# Patient Record
Sex: Male | Born: 1985 | Race: White | Hispanic: Yes | Marital: Married | Smoking: Never smoker
Health system: Southern US, Community
[De-identification: ages and names within clinical notes are randomized; demographics above are authoritative.]

---

## 2013-05-13 ENCOUNTER — Emergency Department (HOSPITAL_COMMUNITY)
Admission: EM | Admit: 2013-05-13 | Discharge: 2013-05-13 | Disposition: A | Discharge status: Home / Self Care | Payer: Self-pay | Attending: Emergency Medicine | Admitting: Emergency Medicine

## 2013-05-13 ENCOUNTER — Encounter (HOSPITAL_COMMUNITY): Payer: Self-pay | Admitting: Emergency Medicine

## 2013-05-13 ENCOUNTER — Emergency Department (HOSPITAL_COMMUNITY): Payer: Self-pay

## 2013-05-13 DIAGNOSIS — S0081XA Abrasion of other part of head, initial encounter: Secondary | ICD-10-CM

## 2013-05-13 DIAGNOSIS — Y9301 Activity, walking, marching and hiking: Secondary | ICD-10-CM | POA: Insufficient documentation

## 2013-05-13 DIAGNOSIS — S0101XA Laceration without foreign body of scalp, initial encounter: Secondary | ICD-10-CM

## 2013-05-13 DIAGNOSIS — F1010 Alcohol abuse, uncomplicated: Secondary | ICD-10-CM | POA: Insufficient documentation

## 2013-05-13 DIAGNOSIS — S0100XA Unspecified open wound of scalp, initial encounter: Secondary | ICD-10-CM | POA: Insufficient documentation

## 2013-05-13 DIAGNOSIS — R4182 Altered mental status, unspecified: Secondary | ICD-10-CM | POA: Insufficient documentation

## 2013-05-13 DIAGNOSIS — W19XXXA Unspecified fall, initial encounter: Secondary | ICD-10-CM

## 2013-05-13 DIAGNOSIS — R296 Repeated falls: Secondary | ICD-10-CM | POA: Insufficient documentation

## 2013-05-13 DIAGNOSIS — IMO0002 Reserved for concepts with insufficient information to code with codable children: Secondary | ICD-10-CM | POA: Insufficient documentation

## 2013-05-13 DIAGNOSIS — F10929 Alcohol use, unspecified with intoxication, unspecified: Secondary | ICD-10-CM

## 2013-05-13 DIAGNOSIS — Y92410 Unspecified street and highway as the place of occurrence of the external cause: Secondary | ICD-10-CM | POA: Insufficient documentation

## 2013-05-13 LAB — COMPREHENSIVE METABOLIC PANEL
AST: 43 U/L — ABNORMAL HIGH (ref 0–37)
Albumin: 4.6 g/dL (ref 3.5–5.2)
Alkaline Phosphatase: 86 U/L (ref 39–117)
BUN: 14 mg/dL (ref 6–23)
CO2: 20 mEq/L (ref 19–32)
Calcium: 9.4 mg/dL (ref 8.4–10.5)
Chloride: 102 mEq/L (ref 96–112)
GFR calc Af Amer: >90 mL/min (ref >90)
GFR calc non Af Amer: >90 mL/min (ref >90)
Glucose, Bld: 124 mg/dL — ABNORMAL HIGH (ref 70–99)
Potassium: 3.4 mEq/L — ABNORMAL LOW (ref 3.5–5.1)
Total Bilirubin: 0.3 mg/dL (ref 0.3–1.2)

## 2013-05-13 LAB — CBC WITH DIFFERENTIAL/PLATELET
Basophils Relative: 0 % (ref 0–1)
Hemoglobin: 17.5 g/dL — ABNORMAL HIGH (ref 13.0–17.0)
Lymphocytes Relative: 17 % (ref 12–46)
MCHC: 36.3 g/dL — ABNORMAL HIGH (ref 30.0–36.0)
MCV: 84.6 fL (ref 78.0–100.0)
Monocytes Absolute: 0.3 10*3/uL (ref 0.1–1.0)
Monocytes Relative: 3 % (ref 3–12)
Neutro Abs: 6.6 10*3/uL (ref 1.7–7.7)
Neutrophils Relative %: 79 % — ABNORMAL HIGH (ref 43–77)
RBC: 5.7 MIL/uL (ref 4.22–5.81)
WBC: 8.3 10*3/uL (ref 4.0–10.5)

## 2013-05-13 LAB — ETHANOL: Alcohol, Ethyl (B): 318 mg/dL — ABNORMAL HIGH (ref 0–11)

## 2013-05-13 MED ORDER — ZIPRASIDONE MESYLATE 20 MG IM SOLR
INTRAMUSCULAR | Status: AC
  Filled 2013-05-13: qty 20

## 2013-05-13 MED ORDER — STERILE WATER FOR INJECTION IJ SOLN
INTRAMUSCULAR | Status: AC
  Administered 2013-05-13: 04:00:00
  Filled 2013-05-13: qty 10

## 2013-05-13 MED ORDER — ZIPRASIDONE MESYLATE 20 MG IM SOLR
20.0000 mg | Freq: Once | INTRAMUSCULAR | Status: AC
  Administered 2013-05-13: 20 mg via INTRAMUSCULAR

## 2013-05-13 NOTE — ED Notes (Signed)
Per Norlene Campbell, MD, Pt can be discharged when he can ambulate independently.

## 2013-05-13 NOTE — ED Notes (Signed)
Bed: ZO10 Expected date:  Expected time:  Means of arrival:  Comments: EMS 27y/oETOH,fall

## 2013-05-13 NOTE — ED Notes (Signed)
Pt got up and began getting dressed.  Pt is unsteady and sts that he does not have anyone to call.  Pt informed that he can not leave until he sobers up.
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
## 2013-05-13 NOTE — ED Notes (Signed)
Pt escorted to discharge window. Verbalized understanding discharge instructions. In no acute distress.  Pt understands that he must have staples removed in 7 days.

## 2013-05-13 NOTE — ED Notes (Signed)
Per Consulting civil engineer, Pt was able to ambulate to restroom w/o difficulty.  This RN spoke w/ Pt and he sts "I'm ready to go home and I can use the bus."

## 2013-05-13 NOTE — ED Provider Notes (Signed)
LACERATION REPAIR Performed by: Dorthula Matas Authorized by: Dorthula Matas Consent: Verbal consent obtained. Risks and benefits: risks, benefits and alternatives were discussed Consent given by: patient Patient identity confirmed: provided demographic data Prepped and Draped in normal sterile fashion Wound explored  Laceration Location: right posterior parietal scalp  Laceration Length: 3 cm  No Foreign Bodies seen or palpated  Skin closure: staples  Number of sutures: 3  Technique: staples  Patient tolerance: Patient tolerated the procedure well with no immediate complications.  Patient seen and evaluated by Dr. Norlene Campbell. Please refer to her note for HPI, ROS, PE, MANAGEMENT and DISPOSITION.     Dorthula Matas, PA-C 05/13/13 [phone]

## 2013-05-13 NOTE — ED Provider Notes (Signed)
Medical screening examination/treatment/procedure(s) were conducted as a shared visit with non-physician practitioner(s) and myself.  I personally evaluated the patient during the encounter.  Please see my note from same date  Olivia Mackie, MD 05/13/13 [phone]
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
## 2014-09-01 IMAGING — CT CT HEAD W/O CM
4 series · 16 of 30 positions shown, 18 images · non-contrast
Comparison: None.

CLINICAL DATA: Status post fall; posterior scalp laceration and
skin tear at the bridge of the nose. Concern for cervical spine
injury.

EXAM:
CT HEAD WITHOUT CONTRAST
CT CERVICAL SPINE WITHOUT CONTRAST
TECHNIQUE: Multidetector CT imaging of the head and cervical spine was
performed following the standard protocol without intravenous
contrast. Multiplanar CT image reconstructions of the cervical spine
were also generated.

[Series 3: head w/o · axial · non-contrast · 0.43mm/px · z∈[+1656,+1706]mm · 2 of 31 slices shown]
[im 11/31  brain]
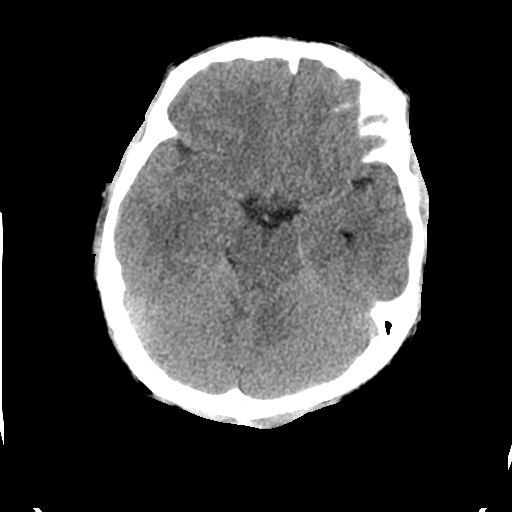
[im 21/31  brain]
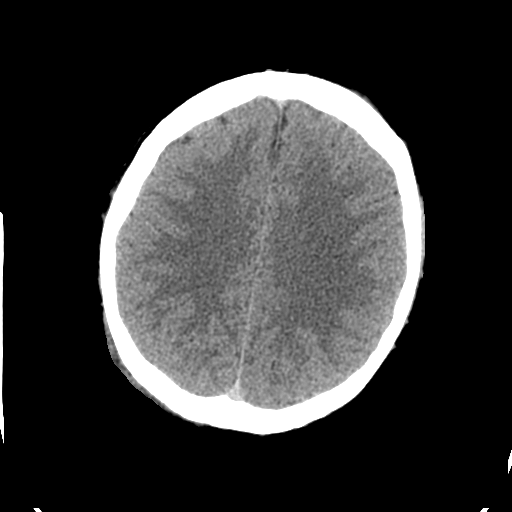

[Series 4: bone windows · axial · 0.43mm/px · z∈[+1636,+1726]mm · 4 of 52 slices shown]
[im 11/52  bone]
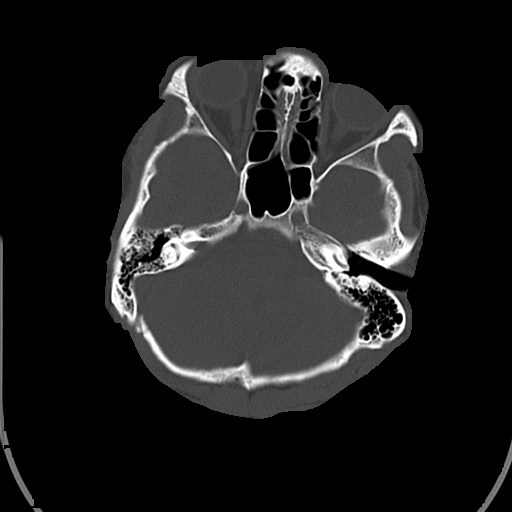
[im 21/52  bone]
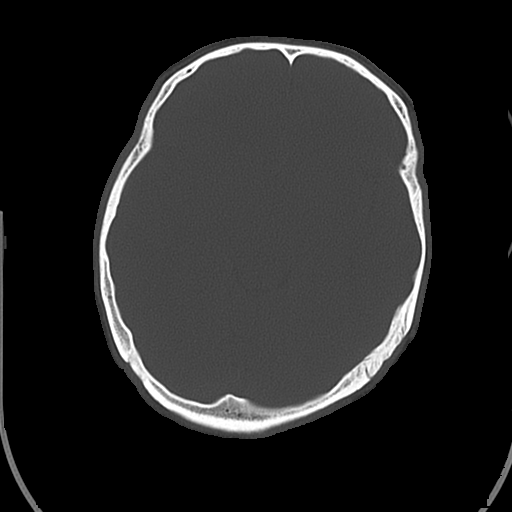
[im 31/52  bone]
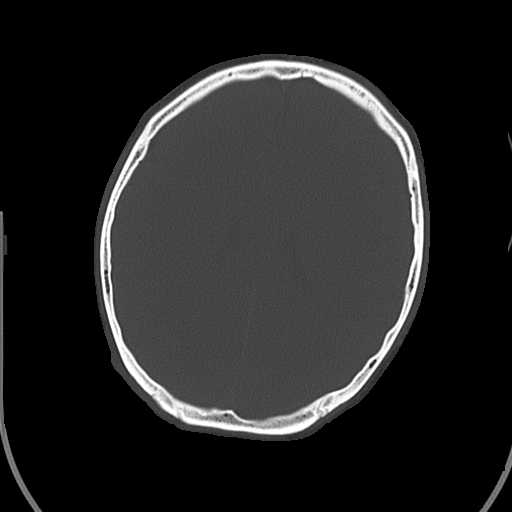
[im 41/52  bone]
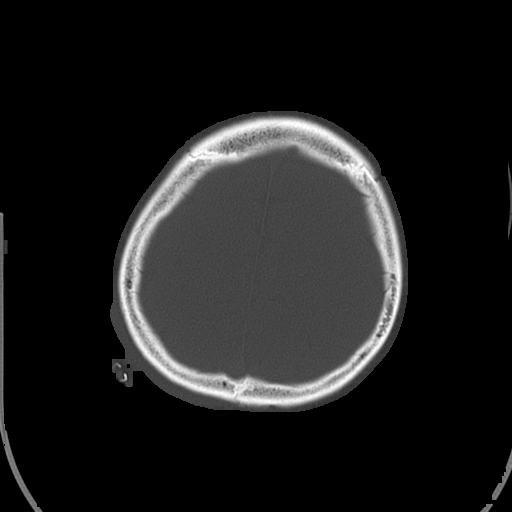

[Series 5: c-spine st · axial · 0.23mm/px · z∈[+1478,+1496]mm · 2 of 78 slices shown]
[im 9/78  brain]
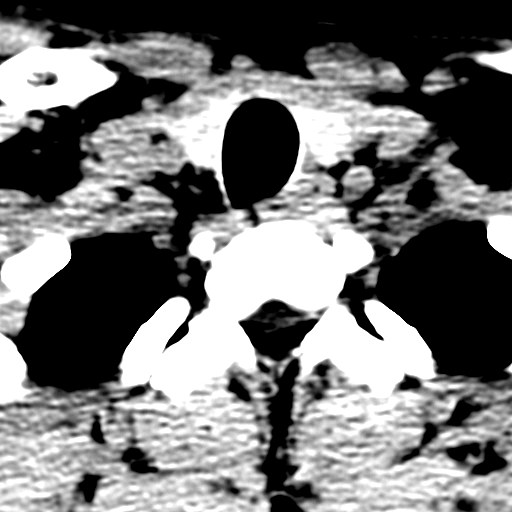
[im 18/78  brain]
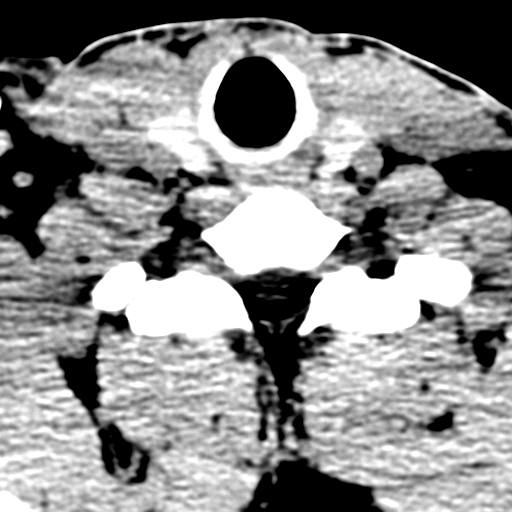

[Series 7: axial recon · axial · 0.20mm/px · z∈[+1467,+1585]mm · 8 of 79 slices shown, 10 images]
[im 9/79  brain]
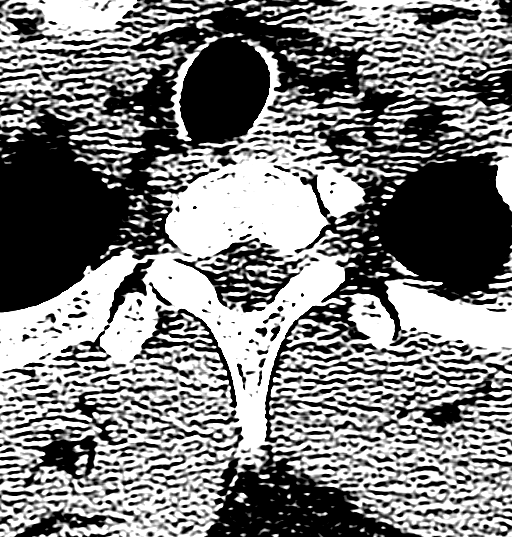
[im 9/79  bone]
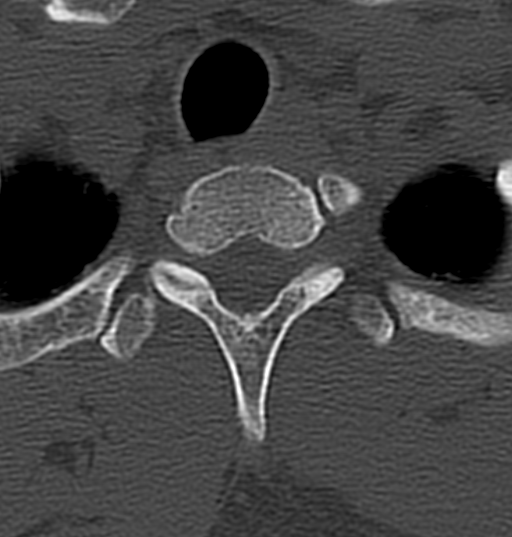
[im 18/79  brain]
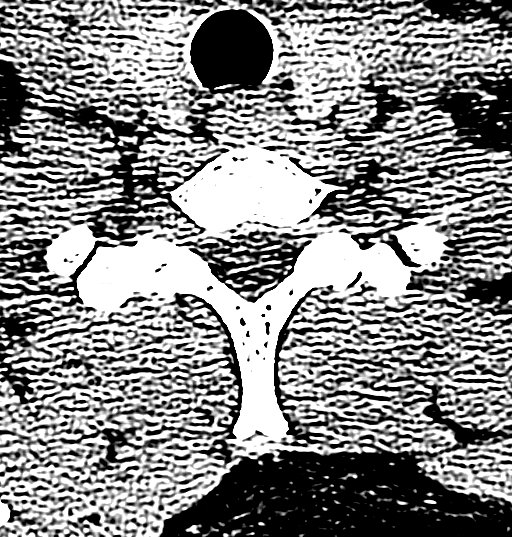
[im 27/79  brain]
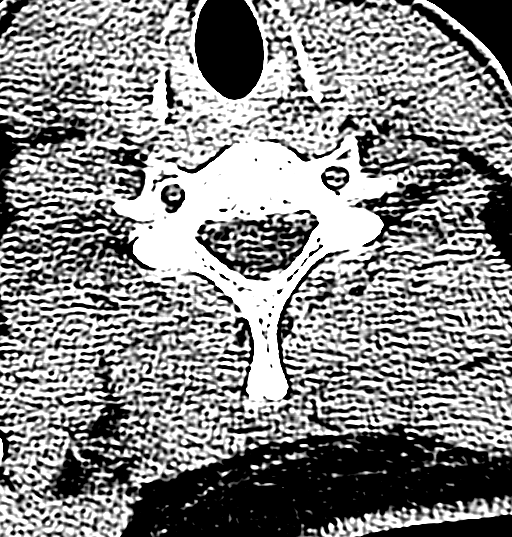
[im 35/79  brain]
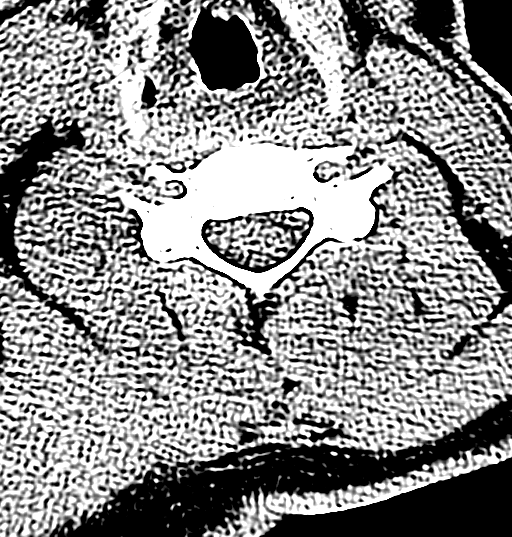
[im 44/79  brain]
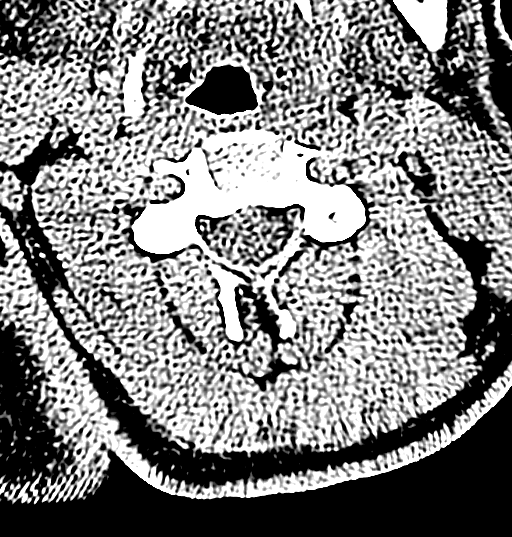
[im 44/79  bone]
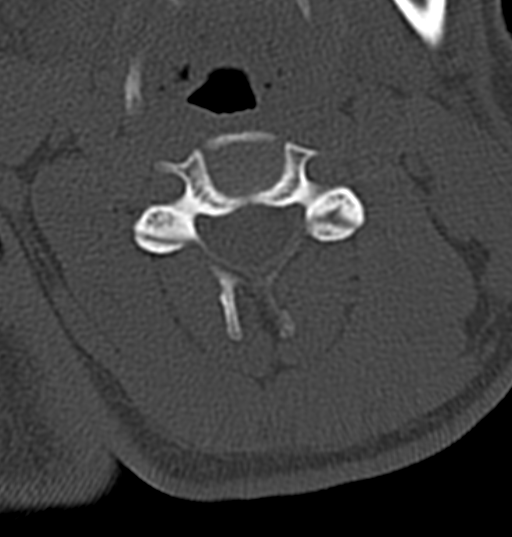
[im 53/79  brain]
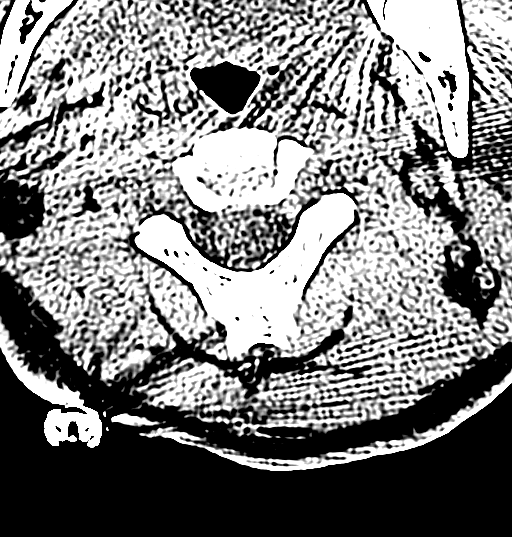
[im 61/79  brain]
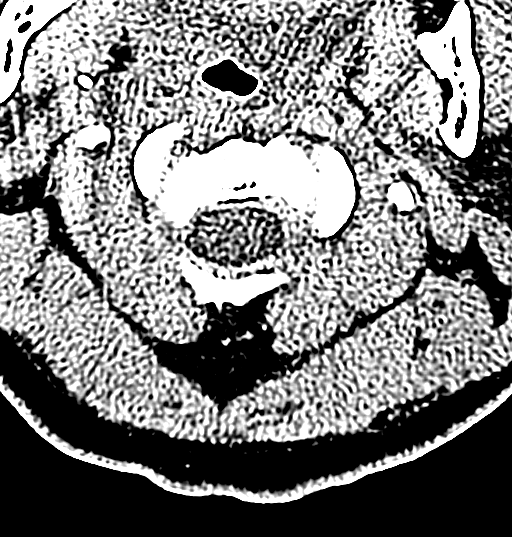
[im 70/79  brain]
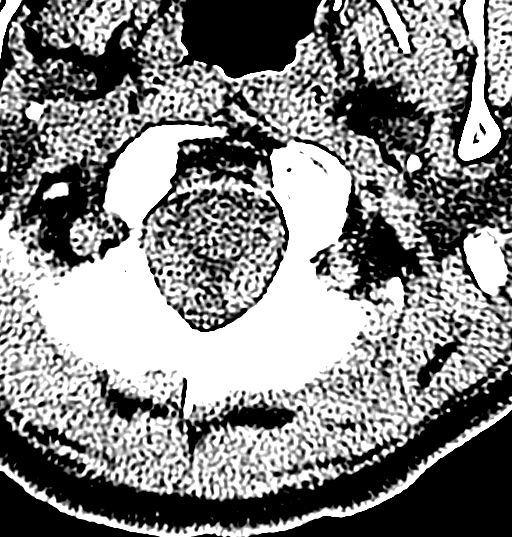

[16 of 30 positions shown; findings below may reference images not displayed]

FINDINGS: CT HEAD FINDINGS

There is no evidence of acute infarction, mass lesion, or intra- or
extra-axial hemorrhage on CT.

The posterior fossa, including the cerebellum, brainstem and fourth
ventricle, is within normal limits. The third and lateral
ventricles, and basal ganglia are unremarkable in appearance. The
cerebral hemispheres are symmetric in appearance, with normal
gray-white differentiation. No mass effect or midline shift is seen.

There is slight irregularity involving the right side of the nasal
bone; this may reflect a fracture of indeterminate age. The orbits
are within normal limits. The paranasal sinuses and mastoid air
cells are well-aerated. Soft tissue swelling is noted at the high
right parietal calvarium, with associated skin staples. Mild soft
tissue swelling is noted overlying the left frontal calvarium.

CT CERVICAL SPINE FINDINGS

There is no evidence of fracture or subluxation. Vertebral bodies
demonstrate normal height and alignment. Intervertebral disc spaces
are preserved. Prevertebral soft tissues are within normal limits.
The visualized neural foramina are grossly unremarkable.

The thyroid gland is unremarkable in appearance. The visualized lung
apices are clear. No significant soft tissue abnormalities are seen.
IMPRESSION: 1. No evidence of traumatic intracranial injury.
2. Slight irregularity involving the right side of the nasal bone;
this may reflect a small fracture of indeterminate age.
3. No evidence of fracture or subluxation along the cervical spine.
4. Soft tissue swelling at the high right parietal calvarium, with
associated skin staples; mild soft tissue swelling overlying the
left frontal calvarium.

## 2020-11-26 ENCOUNTER — Encounter (HOSPITAL_COMMUNITY): Payer: Self-pay

## 2020-11-26 ENCOUNTER — Emergency Department (HOSPITAL_COMMUNITY)
Admission: EM | Admit: 2020-11-26 | Discharge: 2020-11-26 | Payer: Self-pay | Attending: Emergency Medicine | Admitting: Emergency Medicine

## 2020-11-26 ENCOUNTER — Other Ambulatory Visit: Payer: Self-pay

## 2020-11-26 DIAGNOSIS — Z0279 Encounter for issue of other medical certificate: Secondary | ICD-10-CM | POA: Insufficient documentation

## 2020-11-26 DIAGNOSIS — W1809XA Striking against other object with subsequent fall, initial encounter: Secondary | ICD-10-CM | POA: Insufficient documentation

## 2020-11-26 DIAGNOSIS — S0081XA Abrasion of other part of head, initial encounter: Secondary | ICD-10-CM | POA: Insufficient documentation

## 2020-11-26 DIAGNOSIS — Y9289 Other specified places as the place of occurrence of the external cause: Secondary | ICD-10-CM | POA: Insufficient documentation

## 2020-11-26 DIAGNOSIS — Z008 Encounter for other general examination: Secondary | ICD-10-CM

## 2020-11-26 NOTE — Discharge Instructions (Addendum)
Thank you for allowing me to care for you today in the Emergency Department.   Keep the wound on your forehead clean and dry.  These types of wounds have low risk of infection.  You can take Motrin and Tylenol for pain control.  Return to the emergency department if you develop uncontrollable vomiting, visual changes, new numbness or weakness, or other new, concerning symptoms.
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
## 2021-01-09 ENCOUNTER — Emergency Department (HOSPITAL_BASED_OUTPATIENT_CLINIC_OR_DEPARTMENT_OTHER)
Admission: EM | Admit: 2021-01-09 | Discharge: 2021-01-09 | Disposition: A | Discharge status: Home / Self Care | Payer: Self-pay | Attending: Emergency Medicine | Admitting: Emergency Medicine

## 2021-01-09 ENCOUNTER — Emergency Department (HOSPITAL_BASED_OUTPATIENT_CLINIC_OR_DEPARTMENT_OTHER): Payer: Self-pay

## 2021-01-09 ENCOUNTER — Encounter (HOSPITAL_BASED_OUTPATIENT_CLINIC_OR_DEPARTMENT_OTHER): Payer: Self-pay

## 2021-01-09 ENCOUNTER — Other Ambulatory Visit: Payer: Self-pay

## 2021-01-09 DIAGNOSIS — W01198A Fall on same level from slipping, tripping and stumbling with subsequent striking against other object, initial encounter: Secondary | ICD-10-CM | POA: Insufficient documentation

## 2021-01-09 DIAGNOSIS — S060X9A Concussion with loss of consciousness of unspecified duration, initial encounter: Secondary | ICD-10-CM | POA: Insufficient documentation

## 2021-01-09 DIAGNOSIS — Y9302 Activity, running: Secondary | ICD-10-CM | POA: Insufficient documentation
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
